# Patient Record
Sex: Male | Born: 1990 | Race: Black or African American | Hispanic: No | Marital: Single | Smoking: Never smoker
Health system: Southern US, Community
[De-identification: ages and names within clinical notes are randomized; demographics above are authoritative.]

## PROBLEM LIST (undated history)

## (undated) DIAGNOSIS — R011 Cardiac murmur, unspecified: Secondary | ICD-10-CM

## (undated) HISTORY — PX: HERNIA REPAIR: SHX51

## (undated) HISTORY — PX: KNEE SURGERY: SHX244

---

## 2014-02-15 ENCOUNTER — Encounter (HOSPITAL_COMMUNITY): Payer: Self-pay | Admitting: Emergency Medicine

## 2014-02-15 ENCOUNTER — Emergency Department (HOSPITAL_COMMUNITY): Payer: BC Managed Care – PPO

## 2014-02-15 ENCOUNTER — Emergency Department (HOSPITAL_COMMUNITY)
Admission: EM | Admit: 2014-02-15 | Discharge: 2014-02-15 | Disposition: A | Discharge status: Home / Self Care | Payer: BC Managed Care – PPO | Attending: Emergency Medicine | Admitting: Emergency Medicine

## 2014-02-15 DIAGNOSIS — X500XXA Overexertion from strenuous movement or load, initial encounter: Secondary | ICD-10-CM | POA: Insufficient documentation

## 2014-02-15 DIAGNOSIS — Y92838 Other recreation area as the place of occurrence of the external cause: Secondary | ICD-10-CM

## 2014-02-15 DIAGNOSIS — S99919A Unspecified injury of unspecified ankle, initial encounter: Principal | ICD-10-CM

## 2014-02-15 DIAGNOSIS — M25561 Pain in right knee: Secondary | ICD-10-CM

## 2014-02-15 DIAGNOSIS — S8990XA Unspecified injury of unspecified lower leg, initial encounter: Secondary | ICD-10-CM | POA: Insufficient documentation

## 2014-02-15 DIAGNOSIS — Y9367 Activity, basketball: Secondary | ICD-10-CM | POA: Insufficient documentation

## 2014-02-15 DIAGNOSIS — S99929A Unspecified injury of unspecified foot, initial encounter: Principal | ICD-10-CM

## 2014-02-15 DIAGNOSIS — Y9239 Other specified sports and athletic area as the place of occurrence of the external cause: Secondary | ICD-10-CM | POA: Insufficient documentation

## 2014-02-15 DIAGNOSIS — R011 Cardiac murmur, unspecified: Secondary | ICD-10-CM | POA: Insufficient documentation

## 2014-02-15 DIAGNOSIS — Z880 Allergy status to penicillin: Secondary | ICD-10-CM | POA: Insufficient documentation

## 2014-02-15 HISTORY — DX: Cardiac murmur, unspecified: R01.1

## 2014-02-15 MED ORDER — IBUPROFEN 400 MG PO TABS
600.0000 mg | ORAL_TABLET | Freq: Once | ORAL | Status: AC
  Administered 2014-02-15: 600 mg via ORAL
  Filled 2014-02-15 (×2): qty 1

## 2014-02-15 MED ORDER — HYDROCODONE-ACETAMINOPHEN 5-325 MG PO TABS: 2.0000 | ORAL_TABLET | Freq: Four times a day (QID) | ORAL | Status: DC | PRN

## 2014-02-15 NOTE — ED Provider Notes (Signed)
CSN: [id number]     Arrival date & time 02/15/14  2026 History  This chart was scribed for non-physician practitioner, Irish Elders, FNP,working with Dagmar Hait, MD, by Karle Plumber, ED Scribe.  This patient was seen in room TR09C/TR09C and the patient's care was started at 9:55 PM.  Chief Complaint  Patient presents with  . Knee Injury   The history is provided by the patient. No language interpreter was used.   HPI Comments:  Terry Santiago is a 23 y.o. male who presents to the Emergency Department complaining of a right knee injury that occurred several hours ago while playing basketball. He states that another player's shoulder came into direct contact with his knee causing it to hyperextend it. He reports associated pain and swelling. He denies numbness or tingling of the foot. He states he is allergic to PCN.    Past Medical History  Diagnosis Date  . Heart murmur    Past Surgical History  Procedure Laterality Date  . Hernia repair    . Knee surgery     No family history on file.  History  Substance Use Topics  . Smoking status: Never Smoker   . Smokeless tobacco: Not on file  . Alcohol Use: Yes    Review of Systems  Musculoskeletal: Positive for arthralgias (right knee).  All other systems reviewed and are negative.   Allergies  Penicillins  Home Medications   Current Outpatient Rx  Name  Route  Sig  Dispense  Refill  . diclofenac sodium (VOLTAREN) 1 % GEL   Topical   Apply 2 g topically daily as needed.          Triage Vitals: BP 133/88  Pulse 85  Temp(Src) 98.4 F (36.9 C) (Oral)  Resp 14  Ht 5\' 8"  (1.727 m)  Wt 198 lb (89.812 kg)  BMI 30.11 kg/m2  SpO2 100% Physical Exam  Nursing note and vitals reviewed. Constitutional: He is oriented to person, place, and time. He appears well-developed and well-nourished.  HENT:  Head: Normocephalic and atraumatic.  Eyes: EOM are normal.  Neck: Normal range of motion.  Cardiovascular: Normal  rate.   Pulmonary/Chest: Effort normal.  Musculoskeletal: Normal range of motion.  Right knee; full range of motion. No numbness or tingling. Distal pulses 2+, brisk capillary refill. No gait abnormalities.  Neurological: He is alert and oriented to person, place, and time.  Skin: Skin is warm and dry.  Psychiatric: He has a normal mood and affect. His behavior is normal.    ED Course  Procedures (including critical care time) DIAGNOSTIC STUDIES: Oxygen Saturation is 100% on RA, normal by my interpretation.   COORDINATION OF CARE: 9:57 PM- Advised pt to RICE right knee. Will prescribe pain medication and told to follow up with his PCP. Pt verbalizes understanding and agrees to plan.  Medications  ibuprofen (ADVIL,MOTRIN) tablet 600 mg (600 mg Oral Given 02/15/14 2142)    Labs Review Labs Reviewed - No data to display Imaging Review Dg Knee Complete 4 Views Right  02/15/2014   CLINICAL DATA:  Pain post trauma  EXAM: RIGHT KNEE - COMPLETE 4+ VIEW  COMPARISON:  None.  FINDINGS: Frontal, lateral, and bilateral oblique views were obtained. There is no fracture, dislocation, or effusion. Joint spaces appear intact. There is a nonfused apophysis along the proximal tibia anteriorly.  IMPRESSION: No acute fracture or dislocation.  No appreciable joint effusion.   Electronically Signed   By: Bretta Bang M.D.   On:  02/15/2014 21:23     EKG Interpretation None      MDM   Final diagnoses:  Knee pain, right    Right knee x-ray; no fracture, dislocation or joint effusion. Ace wrap applied for support. Ibuprofen given here with some relief. Patient demonstrates full range of motion however, reports pain. Hydrocodone prescription given. Discussed plan with patient and he agrees. Follow RICE instructions. Return if symptoms worsen.  I personally performed the services described in this documentation, which was scribed in my presence. The recorded information has been reviewed and is  accurate.    Irish EldersKelly Brant Peets, NP 02/16/14 2258

## 2014-02-15 NOTE — ED Notes (Signed)
Patient transported to X-ray 

## 2014-02-15 NOTE — Discharge Instructions (Signed)
Knee Sprain A knee sprain is a tear in one of the strong, fibrous tissues that connect the bones (ligaments) in your knee. The severity of the sprain depends on how much of the ligament is torn. The tear can be either partial or complete. CAUSES  Often, sprains are a result of a fall or injury. The force of the impact causes the fibers of your ligament to stretch too much. This excess tension causes the fibers of your ligament to tear. SIGNS AND SYMPTOMS  You may have some loss of motion in your knee. Other symptoms include:  Bruising.  Pain in the knee area.  Tenderness of the knee to the touch.  Swelling. DIAGNOSIS  To diagnose a knee sprain, your health care provider will physically examine your knee. Your health care provider may also suggest an X-ray exam of your knee to make sure no bones are broken. TREATMENT  If your ligament is only partially torn, treatment usually involves keeping the knee in a fixed position (immobilization) or bracing your knee for activities that require movement for several weeks. To do this, your health care provider will apply a bandage, cast, or splint to keep your knee from moving and to support your knee during movement until it heals. For a partially torn ligament, the healing process usually takes 4 6 weeks. If your ligament is completely torn, depending on which ligament it is, you may need surgery to reconnect the ligament to the bone or reconstruct it. After surgery, a cast or splint may be applied and will need to stay on your knee for 4 6 weeks while your ligament heals. HOME CARE INSTRUCTIONS  Keep your injured knee elevated to decrease swelling.  To ease pain and swelling, apply ice to the injured area:  Put ice in a plastic bag.  Place a towel between your skin and the bag.  Leave the ice on for 20 minutes, 2 3 times a day.  Only take medicine for pain as directed by your health care provider.  Do not leave your knee unprotected until  pain and stiffness go away (usually 4 6 weeks).  If you have a cast or splint, do not allow it to get wet. If you have been instructed not to remove it, cover it with a plastic bag when you shower or bathe. Do not swim.  Your health care provider may suggest exercises for you to do during your recovery to prevent or limit permanent weakness and stiffness. SEEK IMMEDIATE MEDICAL CARE IF:  Your cast or splint becomes damaged.  Your pain becomes worse.  You have significant pain, swelling, or numbness below the cast or splint. MAKE SURE YOU:  Understand these instructions.  Will watch your condition.  Will get help right away if you are not doing well or get worse. Document Released: 11/05/2005 Document Revised: 08/26/2013 Document Reviewed: 06/17/2013 [facility] Patient Information 2014 [place], [place].  Follow above instructions Follow-up with your regular doctor Take pain meds as prescribed

## 2014-02-15 NOTE — ED Notes (Addendum)
Pt. injured his right knee while playing basketball this evening . Ambulatory.

## 2014-02-16 NOTE — ED Provider Notes (Signed)
Medical screening examination/treatment/procedure(s) were performed by non-physician practitioner and as supervising physician I was immediately available for consultation/collaboration.   EKG Interpretation None        William Madaline Lefeber, MD 02/16/14 2324 

## 2014-03-04 ENCOUNTER — Encounter (HOSPITAL_COMMUNITY): Payer: Self-pay | Admitting: Emergency Medicine

## 2014-03-04 ENCOUNTER — Emergency Department (HOSPITAL_COMMUNITY)
Admission: EM | Admit: 2014-03-04 | Discharge: 2014-03-04 | Disposition: A | Discharge status: Home / Self Care | Payer: BC Managed Care – PPO | Attending: Emergency Medicine | Admitting: Emergency Medicine

## 2014-03-04 DIAGNOSIS — Z880 Allergy status to penicillin: Secondary | ICD-10-CM | POA: Insufficient documentation

## 2014-03-04 DIAGNOSIS — R011 Cardiac murmur, unspecified: Secondary | ICD-10-CM | POA: Insufficient documentation

## 2014-03-04 DIAGNOSIS — Y9389 Activity, other specified: Secondary | ICD-10-CM | POA: Insufficient documentation

## 2014-03-04 DIAGNOSIS — W268XXA Contact with other sharp object(s), not elsewhere classified, initial encounter: Secondary | ICD-10-CM | POA: Insufficient documentation

## 2014-03-04 DIAGNOSIS — Y929 Unspecified place or not applicable: Secondary | ICD-10-CM | POA: Insufficient documentation

## 2014-03-04 DIAGNOSIS — S60312A Abrasion of left thumb, initial encounter: Secondary | ICD-10-CM

## 2014-03-04 DIAGNOSIS — IMO0002 Reserved for concepts with insufficient information to code with codable children: Secondary | ICD-10-CM | POA: Insufficient documentation

## 2014-03-04 NOTE — Discharge Instructions (Signed)
Keep dressing on for 24-48 hrs. If bleeds through the dressing, change it with another tight bandage. If bleeds through it again, return to ED.

## 2014-03-04 NOTE — ED Notes (Signed)
Laceration to rt. Thumb, distal to nail bed. Bleeding controlled. Pt. Cut it on a deep fryer.

## 2014-03-04 NOTE — ED Provider Notes (Signed)
CSN: [id number]     Arrival date & time 03/04/14  2102 History   First MD Initiated Contact with Patient 03/04/14 2135     This chart was scribed for non-physician practitioner, Jaynie Crumbleatyana Tascha Casares, PA, working with Raeford RazorStephen Kohut, MD by Marica OtterNusrat Rahman, ED Scribe. This patient was seen in room TR08C/TR08C and the patient's care was started at 10:20 PM.  PCP: No PCP Per Patient  Chief Complaint  Patient presents with  . Extremity Laceration   The history is provided by the patient. No language interpreter was used.   HPI Comments: Terry Santiago is a 23 y.o. male who presents to the Emergency Department complaining of a laceration to his right thumb, distal to nail bed onset 2 1/2 hours ago when he cut said area on a deep fryer. States laceration is superficial, but unable to stop it from bleeding. Pt put pressure on the affected area without much relief. Pt denies being on any blood thinners. Pt denies all other symptoms.   Past Medical History  Diagnosis Date  . Heart murmur    Past Surgical History  Procedure Laterality Date  . Hernia repair    . Knee surgery     History reviewed. No pertinent family history. History  Substance Use Topics  . Smoking status: Never Smoker   . Smokeless tobacco: Not on file  . Alcohol Use: Yes    Review of Systems  Skin: Positive for wound (laceration to right thumb, distal to nail bed).  Hematological: Does not bruise/bleed easily.  All other systems reviewed and are negative.     Allergies  Penicillins  Home Medications   Prior to Admission medications   Medication Sig Start Date End Date Taking? Authorizing Provider  diclofenac sodium (VOLTAREN) 1 % GEL Apply 2 g topically daily as needed.    Historical Provider, MD  HYDROcodone-acetaminophen (NORCO/VICODIN) 5-325 MG per tablet Take 2 tablets by mouth every 6 (six) hours as needed. 02/15/14   Irish EldersKelly Walker, NP   Triage Vitals: BP 134/74  Pulse 86  Temp(Src) 98.1 F (36.7 C) (Oral)   Resp 20  Wt 198 lb (89.812 kg)  SpO2 100% Physical Exam  Nursing note and vitals reviewed. Constitutional: He appears well-developed and well-nourished. No distress.  Musculoskeletal:       Hands: Neurological: He is alert.  Skin:  See musculoskeletal exam    ED Course  Procedures (including critical care time) DIAGNOSTIC STUDIES: Oxygen Saturation is 100% on RA, normal by my interpretation.    COORDINATION OF CARE:  10:24 PM-Discussed treatment plan which includes cauterization with pt at bedside and pt agreed to plan.   Labs Review Labs Reviewed - No data to display  Imaging Review No results found.   EKG Interpretation None      MDM   Final diagnoses:  Abrasion of left thumb    Patient with a right thumb abrasion with a small pulsatile vessel, actively bleeding. Bleeding resolved with pressure. I have used a silver nitrate cautery stick to cauterize the area, and used quick clot to the area to stop the bleeding. Pressure dressing applied. We'll discharge home with dressing change and 24-48 hours. Have explained to the patient that if the bleeding becomes severe again, he is to apply pressure dressing, however if unable to stop the bleeding he is to return to emergency department.  Filed Vitals:   03/04/14 2114 03/04/14 2241  BP: 134/74 138/76  Pulse: 86 86  Temp: 98.1 F (36.7 C) 98.3 F (  36.8 C)  TempSrc: Oral Oral  Resp: 20   Weight: 198 lb (89.812 kg)   SpO2: 100% 99%    I personally performed the services described in this documentation, which was scribed in my presence. The recorded information has been reviewed and is accurate.     Lottie Musselatyana A Lalah Durango, PA-C 03/05/14 0102

## 2014-03-08 NOTE — ED Provider Notes (Signed)
Medical screening examination/treatment/procedure(s) were performed by non-physician practitioner and as supervising physician I was immediately available for consultation/collaboration.   EKG Interpretation None       Michille Mcelrath, MD 03/08/14 0920 

## 2014-07-21 ENCOUNTER — Ambulatory Visit (INDEPENDENT_AMBULATORY_CARE_PROVIDER_SITE_OTHER): Payer: BC Managed Care – PPO

## 2014-07-21 ENCOUNTER — Ambulatory Visit (INDEPENDENT_AMBULATORY_CARE_PROVIDER_SITE_OTHER): Payer: BC Managed Care – PPO | Admitting: Family Medicine

## 2014-07-21 DIAGNOSIS — S8012XA Contusion of left lower leg, initial encounter: Secondary | ICD-10-CM

## 2014-07-21 DIAGNOSIS — S8010XA Contusion of unspecified lower leg, initial encounter: Secondary | ICD-10-CM
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
## 2014-09-14 DIAGNOSIS — Z0271 Encounter for disability determination: Secondary | ICD-10-CM

## 2014-10-04 ENCOUNTER — Encounter (HOSPITAL_COMMUNITY): Payer: Self-pay | Admitting: Emergency Medicine

## 2014-10-04 ENCOUNTER — Emergency Department (HOSPITAL_COMMUNITY)
Admission: EM | Admit: 2014-10-04 | Discharge: 2014-10-04 | Disposition: A | Discharge status: Home / Self Care | Payer: BC Managed Care – PPO | Attending: Emergency Medicine | Admitting: Emergency Medicine

## 2014-10-04 ENCOUNTER — Emergency Department (HOSPITAL_COMMUNITY): Payer: BC Managed Care – PPO

## 2014-10-04 DIAGNOSIS — R011 Cardiac murmur, unspecified: Secondary | ICD-10-CM | POA: Diagnosis not present

## 2014-10-04 DIAGNOSIS — G8911 Acute pain due to trauma: Secondary | ICD-10-CM | POA: Insufficient documentation

## 2014-10-04 DIAGNOSIS — Z791 Long term (current) use of non-steroidal anti-inflammatories (NSAID): Secondary | ICD-10-CM | POA: Insufficient documentation

## 2014-10-04 DIAGNOSIS — Z880 Allergy status to penicillin: Secondary | ICD-10-CM | POA: Insufficient documentation

## 2014-10-04 DIAGNOSIS — M25511 Pain in right shoulder: Secondary | ICD-10-CM | POA: Diagnosis not present

## 2014-10-04 NOTE — ED Notes (Signed)
Patient's mother upset that patient did not get MRI while here.   Explained ED protocol and emergent situations.   Mother was understanding and stated she understood, but that she "should have been told up front that he wasn't gonna get an MRI".
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
## 2014-10-04 NOTE — Discharge Instructions (Signed)
1. Medications: ibuprofen, usual home medications 2. Treatment: rest, drink plenty of fluids, use your sling and ice 3. Follow Up: Please followup with your orthopedist in 2-3 days for discussion of your diagnoses and further evaluation after today's visit; if you do not have a primary care doctor use the resource guide provided to find one; Please return to the ER for worsening symptoms    Arthralgia Your caregiver has diagnosed you as suffering from an arthralgia. Arthralgia means there is pain in a joint. This can come from many reasons including:  Bruising the joint which causes soreness (inflammation) in the joint.  Wear and tear on the joints which occur as we grow older (osteoarthritis).  Overusing the joint.  Various forms of arthritis.  Infections of the joint. Regardless of the cause of pain in your joint, most of these different pains respond to anti-inflammatory drugs and rest. The exception to this is when a joint is infected, and these cases are treated with antibiotics, if it is a bacterial infection. HOME CARE INSTRUCTIONS   Rest the injured area for as long as directed by your caregiver. Then slowly start using the joint as directed by your caregiver and as the pain allows. Crutches as directed may be useful if the ankles, knees or hips are involved. If the knee was splinted or casted, continue use and care as directed. If an stretchy or elastic wrapping bandage has been applied today, it should be removed and re-applied every 3 to 4 hours. It should not be applied tightly, but firmly enough to keep swelling down. Watch toes and feet for swelling, bluish discoloration, coldness, numbness or excessive pain. If any of these problems (symptoms) occur, remove the ace bandage and re-apply more loosely. If these symptoms persist, contact your caregiver or return to this location.  For the first 24 hours, keep the injured extremity elevated on pillows while lying down.  Apply ice  for 15-20 minutes to the sore joint every couple hours while awake for the first half day. Then 03-04 times per day for the first 48 hours. Put the ice in a plastic bag and place a towel between the bag of ice and your skin.  Wear any splinting, casting, elastic bandage applications, or slings as instructed.  Only take over-the-counter or prescription medicines for pain, discomfort, or fever as directed by your caregiver. Do not use aspirin immediately after the injury unless instructed by your physician. Aspirin can cause increased bleeding and bruising of the tissues.  If you were given crutches, continue to use them as instructed and do not resume weight bearing on the sore joint until instructed. Persistent pain and inability to use the sore joint as directed for more than 2 to 3 days are warning signs indicating that you should see a caregiver for a follow-up visit as soon as possible. Initially, a hairline fracture (break in bone) may not be evident on X-rays. Persistent pain and swelling indicate that further evaluation, non-weight bearing or use of the joint (use of crutches or slings as instructed), or further X-rays are indicated. X-rays may sometimes not show a small fracture until a week or 10 days later. Make a follow-up appointment with your own caregiver or one to whom we have referred you. A radiologist (specialist in reading X-rays) may read your X-rays. Make sure you know how you are to obtain your X-ray results. Do not assume everything is normal if you do not hear from us. SEEK MEDICAL CARE IF: Bruising,  swelling, or pain increases. SEEK IMMEDIATE MEDICAL CARE IF:   Your fingers or toes are numb or blue.  The pain is not responding to medications and continues to stay the same or get worse.  The pain in your joint becomes severe.  You develop a fever over 102 F (38.9 C).  It becomes impossible to move or use the joint. MAKE SURE YOU:   Understand these  instructions.  Will watch your condition.  Will get help right away if you are not doing well or get worse. Document Released: 11/05/2005 Document Revised: 01/28/2012 Document Reviewed: 06/23/2008 [facility] Patient Information 2015 [place], [place]. This information is not intended to replace advice given to you by your health care provider. Make sure you discuss any questions you have with your health care provider.

## 2014-10-04 NOTE — ED Notes (Signed)
staTES RT SHOULDER PAIN AFTER DISLOCATING IT 3 DAYS AGO STATES IT POPPED BACK IN but it has hurt every since states this happened before and he saw an ortho dr given 3 options ... MRI, surgery orb rest he chose to rest but then played basketball and rehurt arm

## 2015-08-27 IMAGING — CR DG SHOULDER 2+V*R*
3 series · 3 of 3 positions shown · non-contrast
Comparison: None.

CLINICAL DATA: Right shoulder pain.

EXAM:
RIGHT SHOULDER - 2+ VIEW

[w shoulder external right]
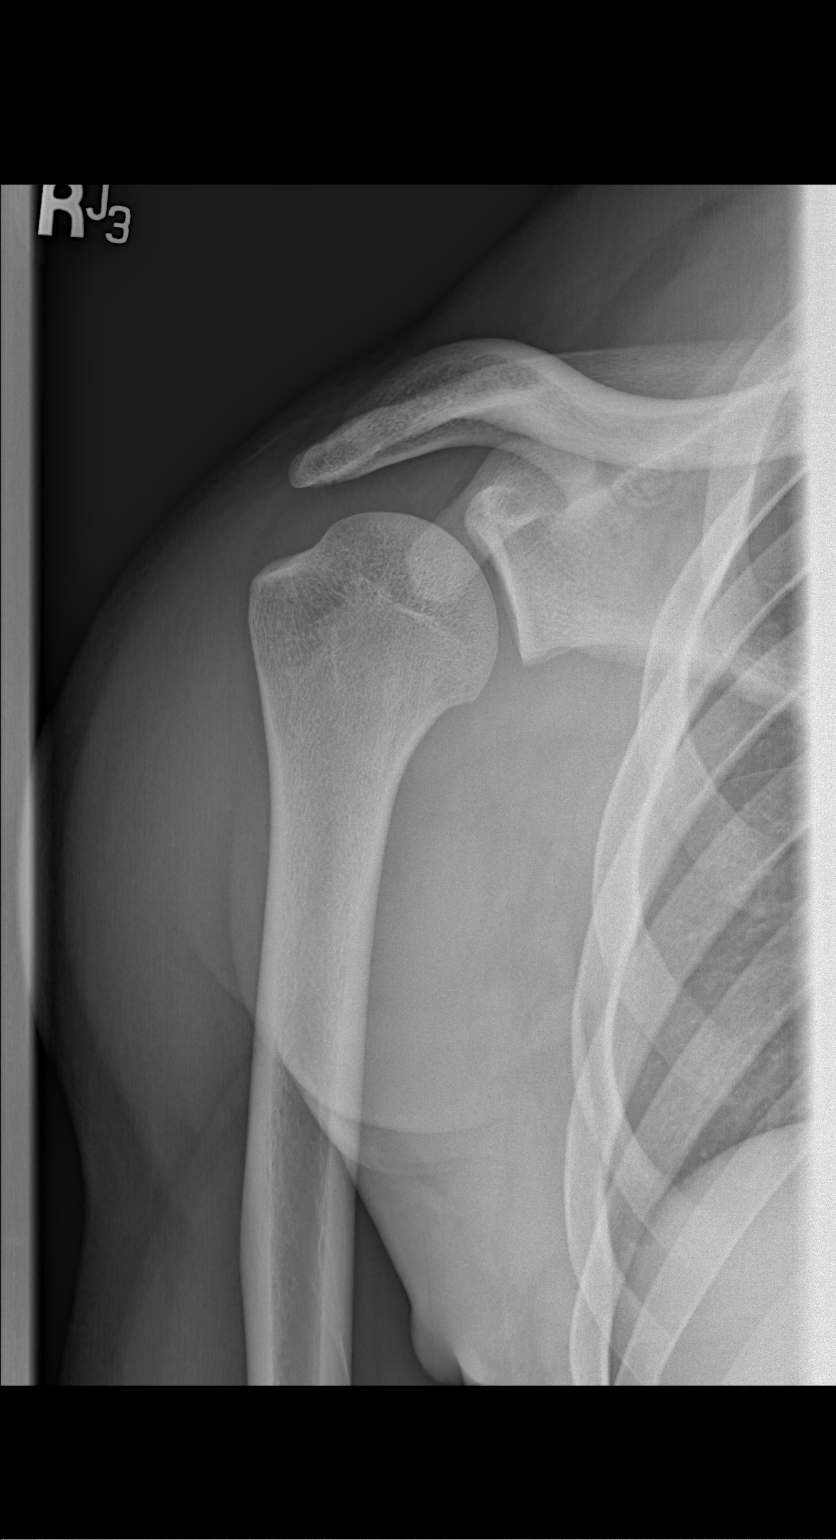

[w shoulder internal right]
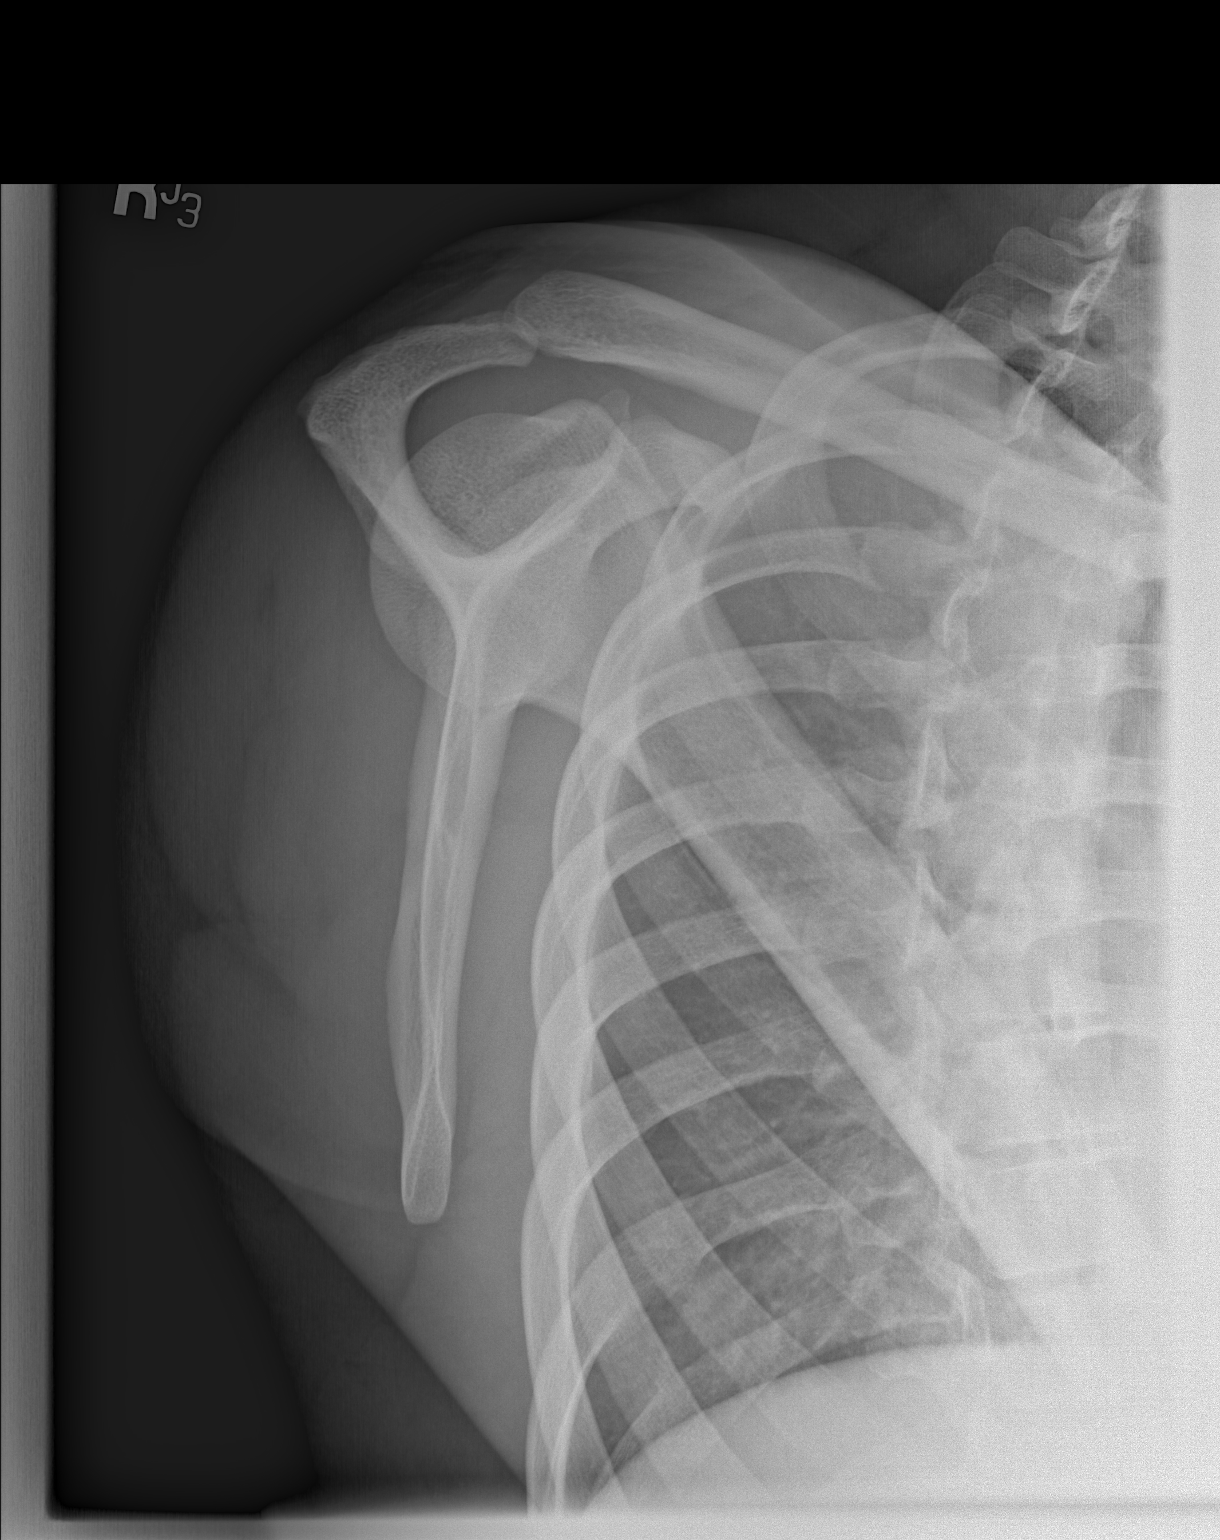

[x shoulder ap right]
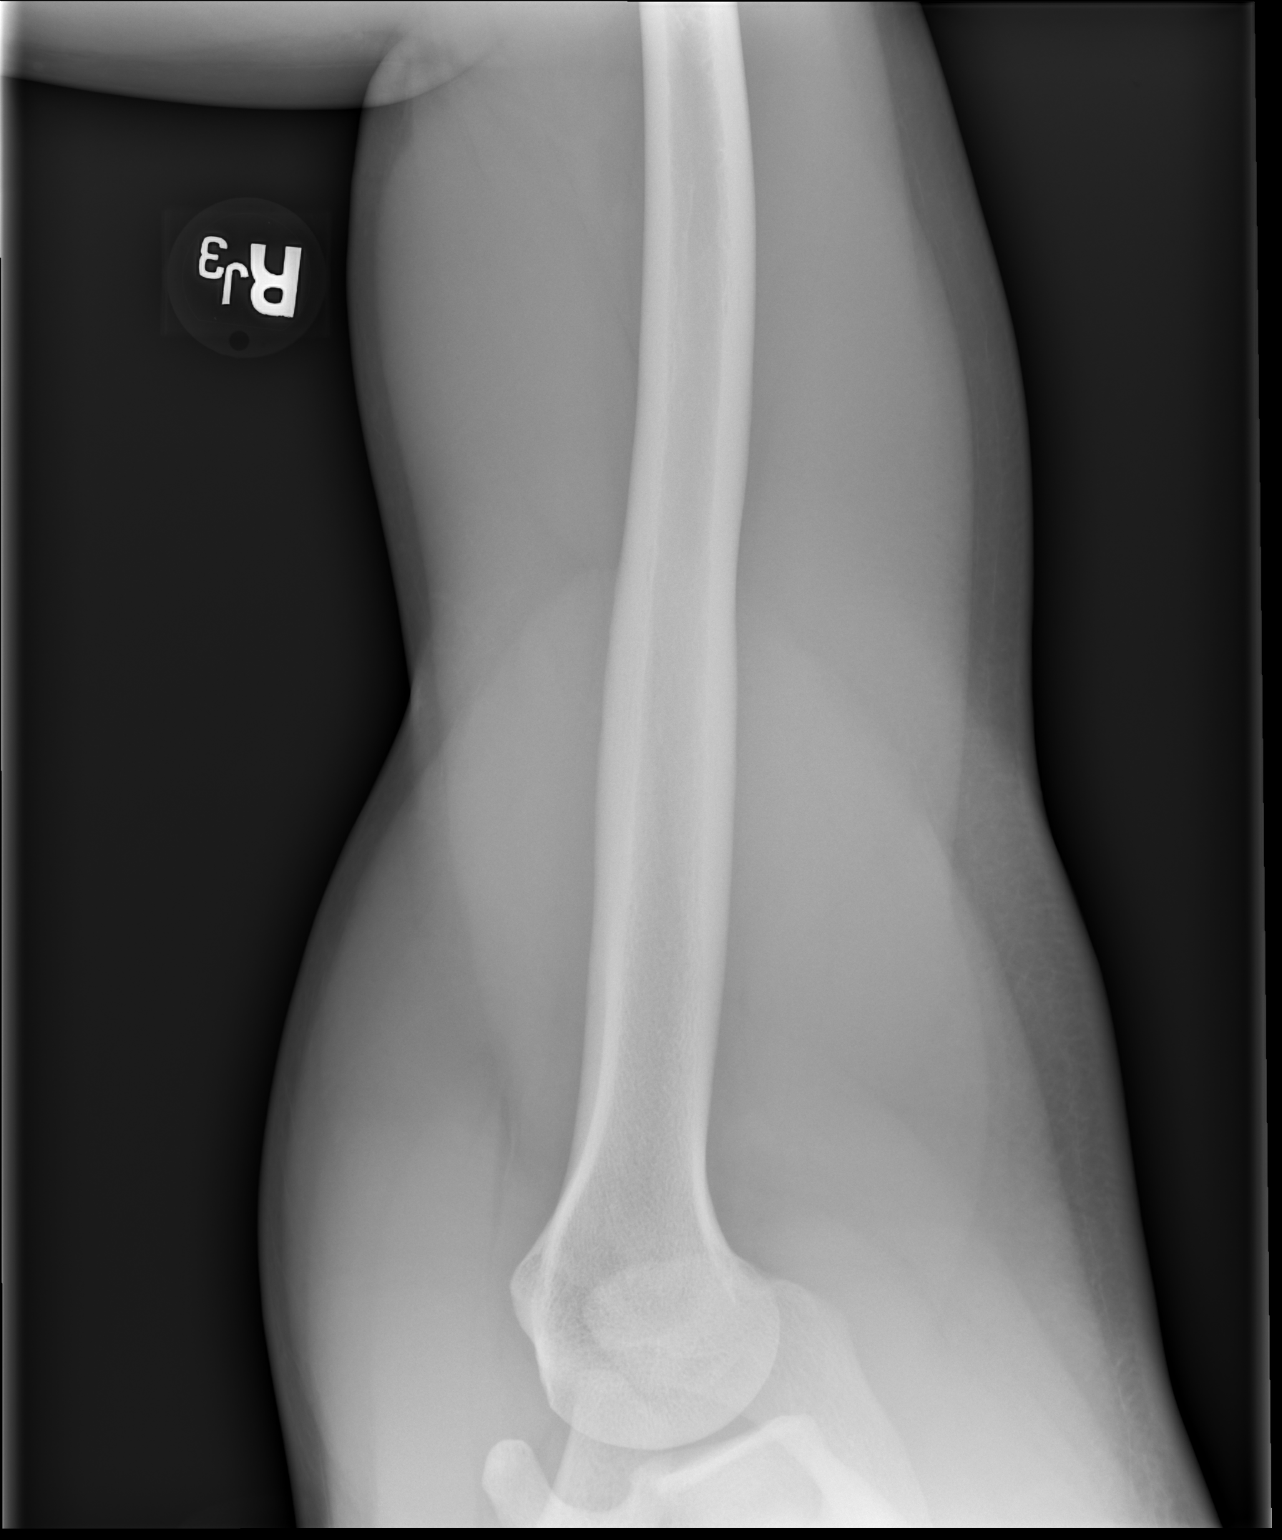

[3 of 3 positions shown; findings below may reference images not displayed]

FINDINGS: There is no evidence of fracture or dislocation. There is no
evidence of arthropathy or other focal bone abnormality. Soft
tissues are unremarkable.
IMPRESSION: Normal right shoulder.
